# Patient Record
Sex: Male | Born: 1945 | Race: White | Hispanic: No | Marital: Married
Health system: Southern US, Community
[De-identification: ages and names within clinical notes are randomized; demographics above are authoritative.]

## PROBLEM LIST (undated history)

## (undated) DIAGNOSIS — I2510 Atherosclerotic heart disease of native coronary artery without angina pectoris: Secondary | ICD-10-CM

## (undated) DIAGNOSIS — E119 Type 2 diabetes mellitus without complications: Secondary | ICD-10-CM

## (undated) DIAGNOSIS — J45909 Unspecified asthma, uncomplicated: Secondary | ICD-10-CM

---

## 1968-03-07 HISTORY — PX: INGUINAL HERNIA REPAIR: SUR1180

## 2010-05-16 ENCOUNTER — Ambulatory Visit: Payer: Self-pay | Admitting: Family Medicine

## 2012-06-13 IMAGING — CR DG CHEST 2V
1 series · 3 of 3 positions shown · non-contrast
Comparison: none

REASON FOR EXAM: scattered wheezes
COMMENTS:   LMP: (Male)

[Series 1: view not recorded · 0.17mm/px · 3 of 3 slices shown]
[im 1/3]
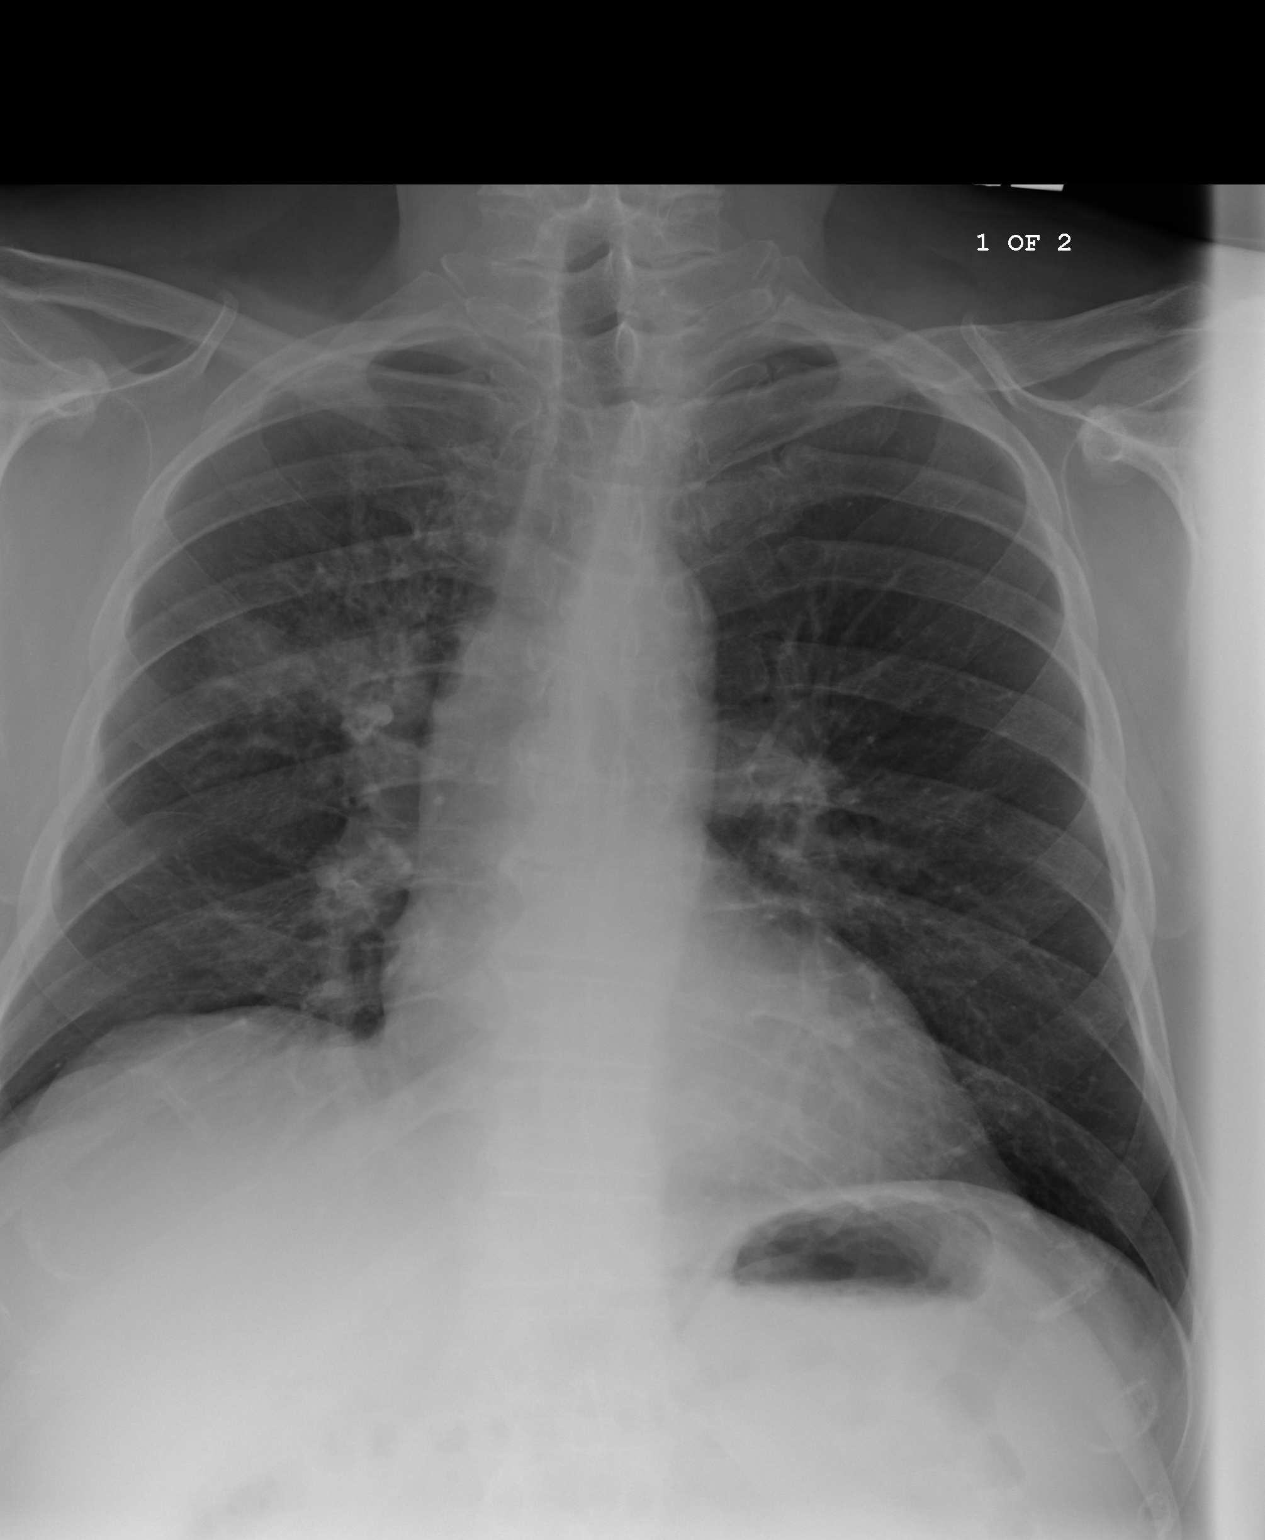
[im 2/3]
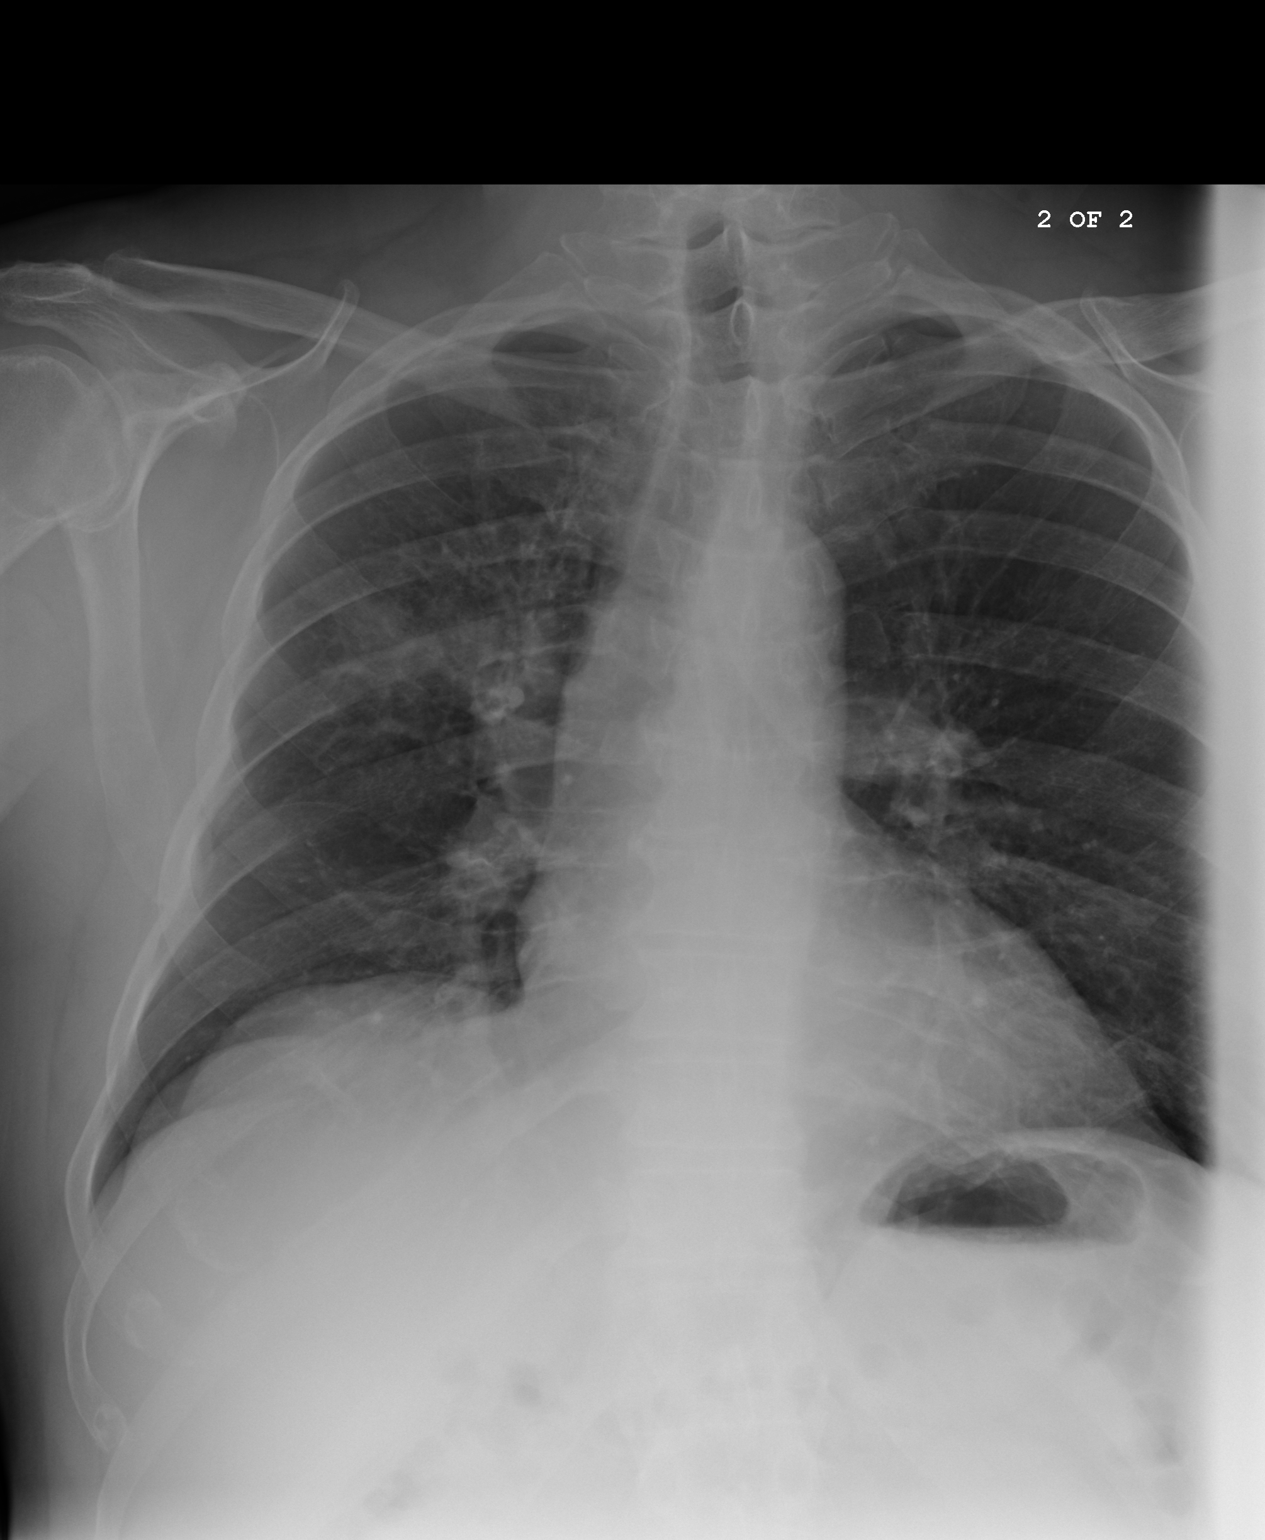
[im 3/3]
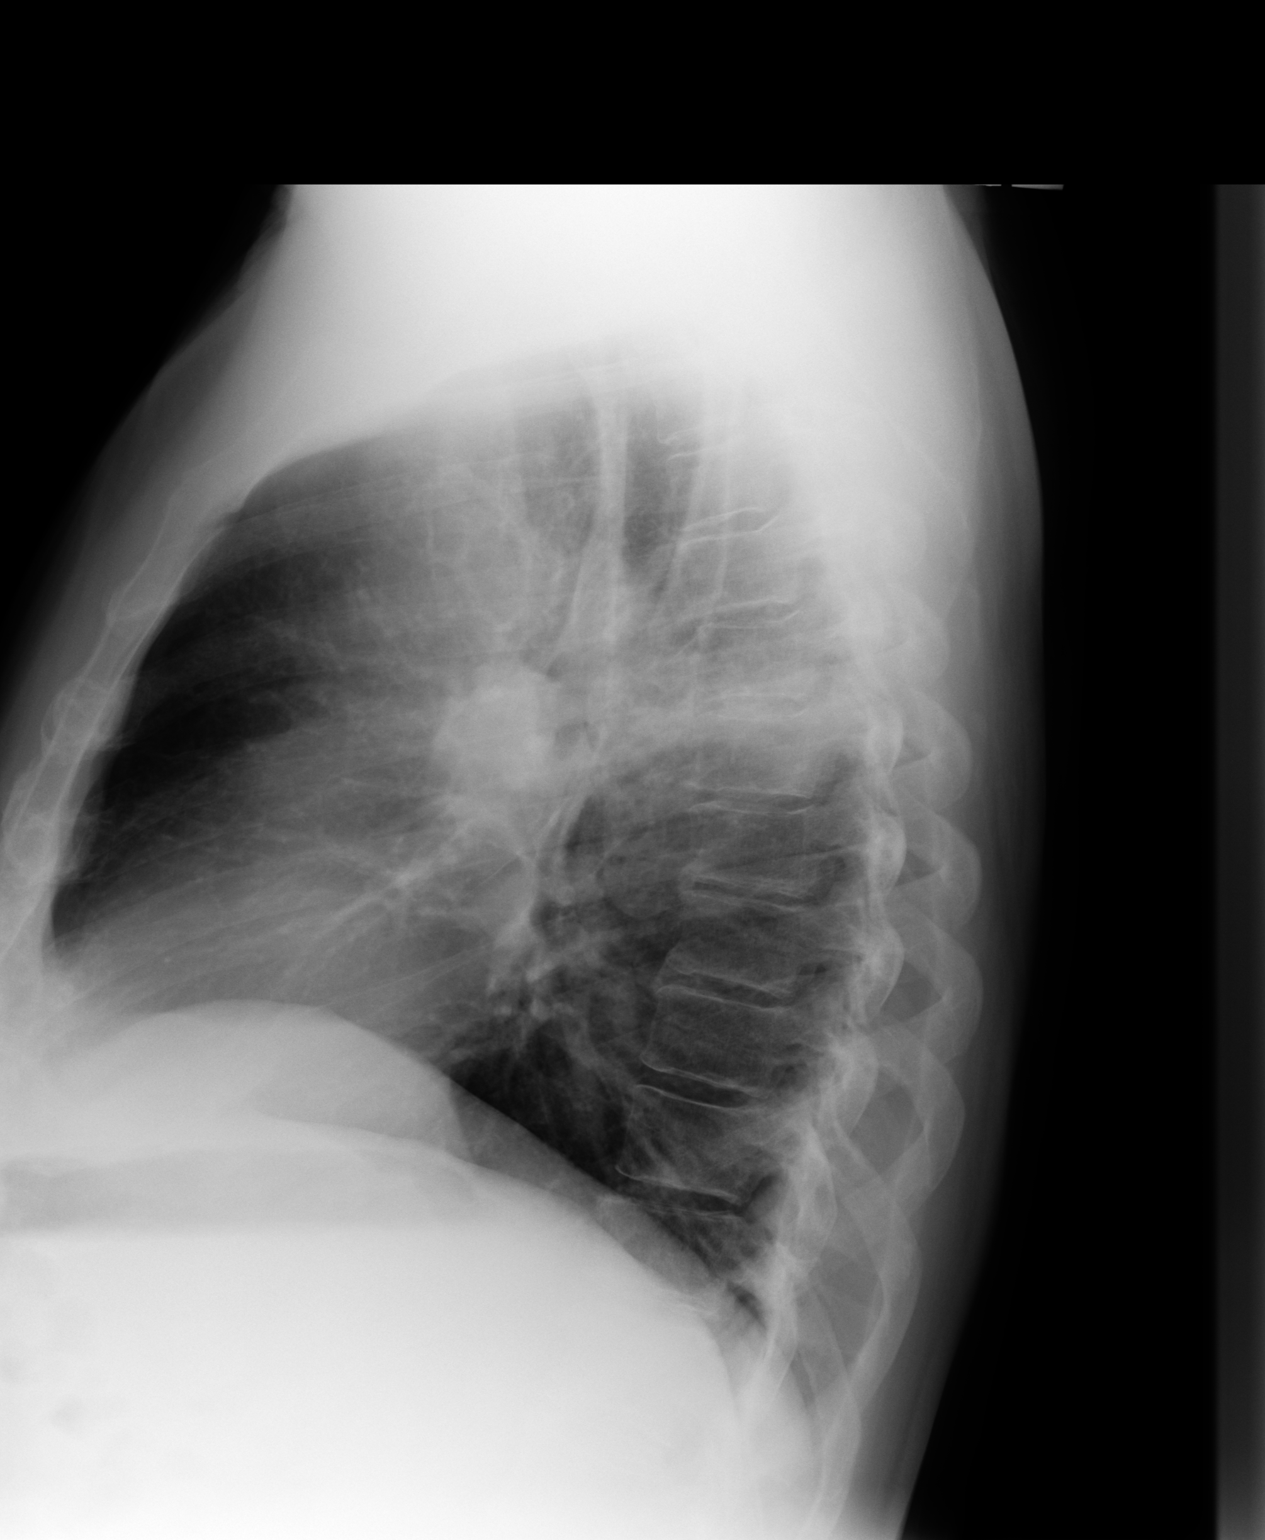

[3 of 3 positions shown; findings below may reference images not displayed]

PROCEDURE:     MDR - MDR CHEST PA(OR AP) AND LATERAL  - May 16, 2010  [DATE]

RESULT:     There are no prior studies for comparison.

The right lung is mildly hypoinflated and there is elevation of the right
hemidiaphragm with respect to the left. There is perihilar increased density
consistent with pneumonia. Infrahilar atelectasis is suspected on the right.
The left lung is clear. The cardiac silhouette is normal in size.
IMPRESSION: The findings suggest pneumonia in the right lower lobe
superiorly or in the posterior inferior aspect of the right upper lobe.
Followup films following therapy are recommended to assure complete clearing.

## 2014-07-13 ENCOUNTER — Ambulatory Visit
Admission: EM | Admit: 2014-07-13 | Discharge: 2014-07-13 | Disposition: A | Discharge status: Home / Self Care | Payer: Medicare Other | Attending: Internal Medicine | Admitting: Internal Medicine

## 2014-07-13 DIAGNOSIS — T1591XA Foreign body on external eye, part unspecified, right eye, initial encounter: Secondary | ICD-10-CM

## 2014-07-13 HISTORY — DX: Atherosclerotic heart disease of native coronary artery without angina pectoris: I25.10

## 2014-07-13 HISTORY — DX: Type 2 diabetes mellitus without complications: E11.9

## 2014-07-13 HISTORY — DX: Unspecified asthma, uncomplicated: J45.909

## 2014-07-13 MED ORDER — TETANUS-DIPHTH-ACELL PERTUSSIS 5-2.5-18.5 LF-MCG/0.5 IM SUSP
0.5000 mL | Freq: Once | INTRAMUSCULAR | Status: AC
  Administered 2014-07-13: 0.5 mL via INTRAMUSCULAR

## 2014-07-13 MED ORDER — SULFACETAMIDE SODIUM 10 % OP SOLN: 1.0000 [drp] | OPHTHALMIC | Status: AC

## 2014-07-13 NOTE — ED Provider Notes (Addendum)
CSN: [id number]     Arrival date & time 07/13/14  1422 History   First MD Initiated Contact with Patient 07/13/14 1511     Chief Complaint  Patient presents with  . Foreign Body in Eye   HPI Patient was grinding metal yesterday afternoon, working on a bathroom fan, and had a small speck of matter fly into his right eye. His daughter was able to visualize it, in the lower lateral aspect of his cornea, but was not able to irrigate it out. Patient denies significant discomfort, but is having a lot of blepharospasm, blinking. No loss of vision.  Past Medical History  Diagnosis Date  . Diabetes mellitus without complication   . Coronary artery disease   . Asthma    Past Surgical History  Procedure Laterality Date  . Inguinal hernia repair  1970   Family History  Problem Relation Age of Onset  . Cancer Mother   . Heart attack Father    History  Substance Use Topics  . Smoking status: Passive Smoke Exposure - Never Smoker  . Smokeless tobacco: Not on file  . Alcohol Use: Yes     Comment: socially    Review of Systems  All other systems reviewed and are negative.   Allergies  Review of patient's allergies indicates no known allergies.  Home Medications   Prior to Admission medications   Medication Sig Start Date End Date Taking? Authorizing Provider  aspirin 81 MG tablet Take 81 mg by mouth daily.   Yes Historical Provider, MD  atorvastatin (LIPITOR) 10 MG tablet Take 10 mg by mouth daily.   Yes Historical Provider, MD  buPROPion (ZYBAN) 150 MG 12 hr tablet Take 150 mg by mouth 2 (two) times daily.   Yes Historical Provider, MD  citalopram (CELEXA) 20 MG tablet Take 20 mg by mouth daily.   Yes Historical Provider, MD  fluticasone-salmeterol (ADVAIR HFA) 230-21 MCG/ACT inhaler Inhale 2 puffs into the lungs 2 (two) times daily.   Yes Historical Provider, MD  glipiZIDE (GLUCOTROL) 10 MG tablet Take 10 mg by mouth daily before breakfast.   Yes Historical Provider, MD  insulin  glargine (LANTUS) 100 UNIT/ML injection Inject 25 Units into the skin at bedtime.   Yes Historical Provider, MD  metformin (FORTAMET) 1000 MG (OSM) 24 hr tablet Take 1,000 mg by mouth 2 (two) times daily with a meal.   Yes Historical Provider, MD          BP 122/71 mmHg  Pulse 70  Temp(Src) 96.8 F (36 C) (Tympanic)  Resp 16  Ht 5\' 6"  (1.676 m)  Wt 189 lb (85.73 kg)  BMI 30.52 kg/m2  SpO2 95% Physical Exam  Constitutional: He is oriented to person, place, and time. No distress.  Alert, nicely groomed  HENT:  Head: Atraumatic.  Eyes:  Conjugate gaze, no eye drainage. Right eye with moderate conjunctival injection, watering. Moderate blepharospasm. No foreign body appreciated on eversion of the right upper lid, none physical beneath the right lower lid. Foreign body is clearly visible in the 7:00 position overlying the iris. Foreign body was removed in 2 fragments with a saline moistened swab; no corneal defect was appreciated on subsequent fluorescein exam. A tiny residual rust ring was appreciated.   Neck: Neck supple.  Cardiovascular: Normal rate.   Pulmonary/Chest: No respiratory distress.  Lungs clear, symmetric breath sounds  Abdominal: Soft. He exhibits no distension.  Musculoskeletal: Normal range of motion.  Neurological: He is alert and oriented to person,  place, and time.  Skin: Skin is warm and dry.  No cyanosis  Nursing note and vitals reviewed.   ED Course  Procedures (including critical care time) Labs Review Labs Reviewed - No data to display  Imaging Review No results found.   Tdap given at urgent care.   MDM   1. Foreign body, eye, right, initial encounter    Patient has follow-up [place] with [place] in [place], Dr. Royetta CrochetPate, in 2 days. The rust ring is not in the visual axis, and as long as discomfort resolves in the next 24 hours I believe it can be safely observed. Rx for sulfacetamide eyedrops. Recheck as soon as possible if severe pain  or vision loss occur.    Eustace MooreLaura W Rondell Pardon, MD 07/13/14 1659  Eustace MooreLaura W Ajahni Nay, MD 07/31/14 [phone]

## 2014-07-13 NOTE — Discharge Instructions (Signed)
°  Use eye drops as directed.  Have eye rechecked for worsening pain, vision loss.  See eye doctor Tuesday 5/10 as planned for recheck about residual rust ring. Eye, Foreign Body A foreign body is an object that should not be there. The object could be near, on, or in the eye. HOME CARE If your doctor prescribes an eye patch:  Keep the eye patch on. Do this until you see your doctor again.  Do not remove the patch to put in medicine unless your doctor tells you.  Retape it as it was before:  When replacing the patch.  If the patch comes loose.  Do not drive or use machinery.  Only take medicine as told by your doctor. If your doctor does not prescribe an eye patch:  Keep the eye closed as much as possible.  Do not rub the eye.  Wear dark glasses in bright light.  Do not wear contact lenses until the eye feels normal, or as told by your doctor.  Wear protective eye covering, especially when using high speed tools.  Only take medicine as told by your doctor. GET HELP RIGHT AWAY IF:   Your pain gets worse.  Your vision changes.  You have problems with the eye patch.  The injury gets larger.  There is fluid (discharge) coming from the eye.  You get puffiness (swelling) and soreness.  You have an oral temperature above 102 F (38.9 C), not controlled by medicine.  Your baby is older than 3 months with a rectal temperature of 102 F (38.9 C) or higher.  Your baby is 703 months old or younger with a rectal temperature of 100.4 F (38 C) or higher. MAKE SURE YOU:   Understand these instructions.  Will watch your condition.  Will get help right away if you are not doing well or get worse. Document Released: 08/11/2009 Document Revised: 05/16/2011 Document Reviewed: 07/19/2012 [facility] Patient Information 2015 [place] [place]. This information is not intended to replace advice given to you by your health care provider. Make sure you discuss any questions you have with  your health care provider.

## 2014-07-13 NOTE — ED Notes (Signed)
Yesterday using a grinder and thinks a piece of metal went into right eye. Daughter is a Engineer, civil (consulting)nurse and flushed eye after noting metal in the eye. Remains red and sore. Snellan chart right eye  20/30  Left eye 20/25. Unsure of Tdap status

## 2015-04-04 DIAGNOSIS — G4733 Obstructive sleep apnea (adult) (pediatric): Secondary | ICD-10-CM | POA: Insufficient documentation

## 2016-06-15 DIAGNOSIS — E119 Type 2 diabetes mellitus without complications: Secondary | ICD-10-CM | POA: Insufficient documentation

## 2016-06-15 DIAGNOSIS — Z794 Long term (current) use of insulin: Secondary | ICD-10-CM | POA: Insufficient documentation

## 2017-05-03 DIAGNOSIS — I10 Essential (primary) hypertension: Secondary | ICD-10-CM | POA: Insufficient documentation

## 2020-02-04 ENCOUNTER — Ambulatory Visit: Payer: Medicare Other | Admitting: Internal Medicine

## 2020-02-04 NOTE — Progress Notes (Signed)
Patient was no-show for appointment.  The office staff will contact the patient for rescheduling follow-up. 

## 2020-02-05 ENCOUNTER — Ambulatory Visit (INDEPENDENT_AMBULATORY_CARE_PROVIDER_SITE_OTHER): Payer: Medicare Other | Admitting: Internal Medicine

## 2020-02-05 VITALS — BP 147/77 | HR 70 | Temp 97.9°F | Resp 18 | Ht 66.0 in | Wt 186.0 lb

## 2020-02-05 DIAGNOSIS — G4733 Obstructive sleep apnea (adult) (pediatric): Secondary | ICD-10-CM | POA: Diagnosis not present

## 2020-02-05 DIAGNOSIS — Z9989 Dependence on other enabling machines and devices: Secondary | ICD-10-CM | POA: Diagnosis not present

## 2020-02-05 DIAGNOSIS — Z7189 Other specified counseling: Secondary | ICD-10-CM | POA: Diagnosis not present

## 2020-02-05 DIAGNOSIS — I10 Essential (primary) hypertension: Secondary | ICD-10-CM | POA: Diagnosis not present

## 2020-02-05 NOTE — Progress Notes (Signed)
[facility] [address] [place], [place] [address]  Pulmonary Sleep Medicine   Office Visit Note  Patient Name: Stuart Ball DOB: [birth year] MRN [id number]    Chief Complaint: Obstructive Sleep Apnea visit  Brief History:  [name] is seen today for initial consultation The patient has a 4 year history of sleep apnea. Patient is not using PAP nightly. He is getting condensation in his tubing so he stopped using it but prior to this reported benefiting greatly from the CPAP The patient feels more rested after sleeping with PAP.  He is only averaging 6 hours of sleep at night. Reported sleepiness is  Improved on CPAP but worse now that he stopped using it. the Epworth Sleepiness Score is10 out of 24. T The patient complains of the following: condensation.  The compliance download shows poor  compliance with an average use time of 5.1 hours. The AHI is 8.6 The patient does not complain of limb movements disrupting sleep.  ROS  General: (-) fever, (-) chills, (-) night sweat Nose and Sinuses: (-) nasal stuffiness or itchiness, (-) postnasal drip, (-) nosebleeds, (-) sinus trouble. Mouth and Throat: (-) sore throat, (-) hoarseness. Neck: (-) swollen glands, (-) enlarged thyroid, (-) neck pain. Respiratory: + cough, - shortness of breath, - wheezing. Neurologic: - numbness, - tingling. Psychiatric: - anxiety, - depression   Current Medication: Outpatient Encounter Medications as of 02/05/2020  Medication Sig  . albuterol (VENTOLIN HFA) 108 (90 Base) MCG/ACT inhaler Inhale into the lungs.  . Aspirin Buf,CaCarb-MgCarb-MgO, 81 MG TABS Take by mouth.  Marland Kitchen buPROPion (WELLBUTRIN SR) 150 MG 12 hr tablet Take by mouth.  . metoprolol tartrate (LOPRESSOR) 50 MG tablet Take 1 tablet by mouth 2 (two) times daily.  Marland Kitchen omeprazole (PRILOSEC) 20 MG capsule TAKE 1 CAPSULE BY MOUTH IN THE EVENING  . aspirin 81 MG tablet Take 81 mg by mouth daily.  Marland Kitchen atorvastatin (LIPITOR) 10 MG tablet Take 10  mg by mouth daily.  Marland Kitchen buPROPion (ZYBAN) 150 MG 12 hr tablet Take 150 mg by mouth 2 (two) times daily.  . citalopram (CELEXA) 20 MG tablet Take 20 mg by mouth daily.  . fluticasone-salmeterol (ADVAIR HFA) 230-21 MCG/ACT inhaler Inhale 2 puffs into the lungs 2 (two) times daily.  Marland Kitchen glipiZIDE (GLUCOTROL) 10 MG tablet Take 10 mg by mouth daily before breakfast.  . insulin glargine (LANTUS) 100 UNIT/ML injection Inject 25 Units into the skin at bedtime.  . metformin (FORTAMET) 1000 MG (OSM) 24 hr tablet Take 1,000 mg by mouth 2 (two) times daily with a meal.  . OZEMPIC, 1 MG/DOSE, 2 MG/1.5ML SOPN Inject into the skin.   No facility-administered encounter medications on file as of 02/05/2020.    Surgical History: Past Surgical History:  Procedure Laterality Date  . INGUINAL HERNIA REPAIR  1970    Medical History: Past Medical History:  Diagnosis Date  . Asthma   . Coronary artery disease   . Diabetes mellitus without complication (HCC)     Family History: Non contributory to the present illness  Social History: Social History   Socioeconomic History  . Marital status: Married    Spouse name: Not on file  . Number of children: Not on file  . Years of education: Not on file  . Highest education level: Not on file  Occupational History  . Not on file  Tobacco Use  . Smoking status: Passive Smoke Exposure - Never Smoker  . Smokeless tobacco: Never Used  Substance and  Sexual Activity  . Alcohol use: Yes    Comment: socially  . Drug use: Not on file  . Sexual activity: Not on file  Other Topics Concern  . Not on file  Social History Narrative  . Not on file   Social Determinants of Health   Financial Resource Strain:   . Difficulty of Paying Living Expenses: Not on file  Food Insecurity:   . Worried About Programme researcher, broadcasting/film/video in the Last Year: Not on file  . Ran Out of Food in the Last Year: Not on file  Transportation Needs:   . Lack of Transportation (Medical): Not on  file  . Lack of Transportation (Non-Medical): Not on file  Physical Activity:   . Days of Exercise per Week: Not on file  . Minutes of Exercise per Session: Not on file  Stress:   . Feeling of Stress : Not on file  Social Connections:   . Frequency of Communication with Friends and Family: Not on file  . Frequency of Social Gatherings with Friends and Family: Not on file  . Attends Religious Services: Not on file  . Active Member of Clubs or Organizations: Not on file  . Attends Banker Meetings: Not on file  . Marital Status: Not on file  Intimate Partner Violence:   . Fear of Current or Ex-Partner: Not on file  . Emotionally Abused: Not on file  . Physically Abused: Not on file  . Sexually Abused: Not on file    Vital Signs: Blood pressure (!) 147/77, pulse 70, temperature 97.9 F (36.6 C), resp. rate 18, height 5\' 6"  (1.676 m), weight 186 lb (84.4 kg), SpO2 95 %.  Examination: General Appearance: The patient is well-developed, well-nourished, and in no distress. Neck Circumference: 10cm Skin: Gross inspection of skin unremarkable. Head: normocephalic, no gross deformities. Eyes: no gross deformities noted. ENT: ears appear grossly normal Neurologic: Alert and oriented. No involuntary movements.    EPWORTH SLEEPINESS SCALE:  Scale:  (0)= no chance of dozing; (1)= slight chance of dozing; (2)= moderate chance of dozing; (3)= high chance of dozing  Chance  Situtation    Sitting and reading: 2    Watching TV: 2    Sitting Inactive in public: 1    As a passenger in car: 1      Lying down to rest: 2    Sitting and talking: 0    Sitting quielty after lunch: 2    In a car, stopped in traffic: 0   TOTAL SCORE:   10 out of 24    SLEEP STUDIES:  1. Split 04/01/15 AHI 20 Spo2Min 85%   CPAP COMPLIANCE DATA:  Date Range: 12/20/19-01/18/20  Average Daily Use: 5 hours  Median Use: 4  Compliance for > 4 Hours:  33%  AHI: 8.2 respiratory  events per hour  Days Used: 13/30  Mask Leak: 50.9  95th Percentile Pressure: CPAP 12cm H20         LABS: No results found for this or any previous visit (from the past 2160 hour(s)).  Radiology: No results found.  No results found.  No results found.    Assessment and Plan: Patient Active Problem List   Diagnosis Date Noted  . Obstructive sleep apnea 04/04/2015      The patient does tolerate PAP and reports significant benefit from PAP use when using it. He stopped because of water in the tubing and he was shown how to adjust the humidifier and  tubing temperature.. The patient was  advised to increase his time in bed and he should replace his supplies more frequently.. The patient was also counselled on the importance of regular exercise. The compliance is poor. The AHI is elevated due to mask leak and short use.   1. OSA- use CPAP nightly, replace supplies regularly. 2. CPAP couseling-Discussed importance of adequate CPAP use as well as proper care and cleaning techniques of machine and all supplies. 3. HTN - followed and managed by PCP  General Counseling: I have discussed the findings of the evaluation and examination with Renae Fickle.  I have also discussed any further diagnostic evaluation thatmay be needed or ordered today. Shawan verbalizes understanding of the findings of todays visit. We also reviewed his medications today and discussed drug interactions and side effects including but not limited excessive drowsiness and altered mental states. We also discussed that there is always a risk not just to him but also people around him. he has been encouraged to call the office with any questions or concerns that should arise related to todays visit.  No orders of the defined types were placed in this encounter.   This patient was seen by Layla Barter, AGNP-C in collaboration with Dr. Freda Munro as a part of collaborative care agreement.     I have personally  obtained a history, examined the patient, evaluated laboratory and imaging results, formulated the assessment and plan and placed orders.   Valentino Hue Sol Blazing, PhD, FAASM  Diplomate, American Board of Sleep Medicine    Yevonne Pax, MD [facility] Diplomate ABMS Pulmonary and Critical Care Medicine Sleep medicine

## 2020-02-05 NOTE — Patient Instructions (Signed)

## 2020-04-06 NOTE — Progress Notes (Signed)
[facility] [address] [place], [place] [address]  Pulmonary Sleep Medicine   Office Visit Note  Patient Name: Stuart Ball DOB: 10-31-1945 MRN [id number]    Chief Complaint: Obstructive Sleep Apnea visit  Brief History:  Stuart Ball is seen today for follow up The patient has a 4 year history of sleep apnea but was not using his CPAP at his last visit. Patient is not using PAP nightly.  The patient feels more rested after sleeping with PAP.  The patient reports benefiting from PAP use.His sleep time is variable as he is a night owl. He also does not use the CPAP when his grandchildren are over as he sleeps in another room.Reported sleepiness is  improved and the Epworth Sleepiness Score is 4 out of 24. The patient does not take intentional  take naps. The patient complains of the following: dry mouth and his mouth may be opening  The compliance download shows fair compliance with an average use time of 6.2 hours. The AHI is 6.9  The patient does not of limb movements disrupting sleep.  ROS  General: (-) fever, (-) chills, (-) night sweat Nose and Sinuses: (-) nasal stuffiness or itchiness, (-) postnasal drip, (-) nosebleeds, (-) sinus trouble. Mouth and Throat: (-) sore throat, (-) hoarseness. Neck: (-) swollen glands, (-) enlarged thyroid, (-) neck pain. Respiratory: - cough, - shortness of breath, - wheezing. Neurologic: - numbness, - tingling. Psychiatric: - anxiety, - depression   Current Medication: Outpatient Encounter Medications as of 04/07/2020  Medication Sig  . albuterol (VENTOLIN HFA) 108 (90 Base) MCG/ACT inhaler Inhale into the lungs.  Marland Kitchen aspirin 81 MG tablet Take 81 mg by mouth daily.  . Aspirin Buf,CaCarb-MgCarb-MgO, 81 MG TABS Take by mouth.  Marland Kitchen atorvastatin (LIPITOR) 10 MG tablet Take 10 mg by mouth daily.  Marland Kitchen buPROPion (WELLBUTRIN SR) 150 MG 12 hr tablet Take by mouth.  Marland Kitchen buPROPion (ZYBAN) 150 MG 12 hr tablet Take 150 mg by mouth 2 (two) times daily.  .  citalopram (CELEXA) 20 MG tablet Take 20 mg by mouth daily.  . fluticasone-salmeterol (ADVAIR HFA) 230-21 MCG/ACT inhaler Inhale 2 puffs into the lungs 2 (two) times daily.  Marland Kitchen glipiZIDE (GLUCOTROL) 10 MG tablet Take 10 mg by mouth daily before breakfast.  . insulin aspart (NOVOLOG FLEXPEN) 100 UNIT/ML FlexPen Novolog Flexpen U-100 Insulin aspart 100 unit/mL (3 mL) subcutaneous  . insulin glargine (LANTUS) 100 UNIT/ML injection Inject 25 Units into the skin at bedtime.  Marland Kitchen JARDIANCE 25 MG TABS tablet Take 25 mg by mouth daily.  . metformin (FORTAMET) 1000 MG (OSM) 24 hr tablet Take 1,000 mg by mouth 2 (two) times daily with a meal.  . metoprolol tartrate (LOPRESSOR) 50 MG tablet Take 1 tablet by mouth 2 (two) times daily.  Marland Kitchen omeprazole (PRILOSEC) 20 MG capsule TAKE 1 CAPSULE BY MOUTH IN THE EVENING  . OZEMPIC, 1 MG/DOSE, 2 MG/1.5ML SOPN Inject into the skin.   No facility-administered encounter medications on file as of 04/07/2020.    Surgical History: Past Surgical History:  Procedure Laterality Date  . INGUINAL HERNIA REPAIR  1970    Medical History: Past Medical History:  Diagnosis Date  . Asthma   . Coronary artery disease   . Diabetes mellitus without complication (HCC)     Family History: Non contributory to the present illness  Social History: Social History   Socioeconomic History  . Marital status: Married    Spouse name: Not on file  . Number  of children: Not on file  . Years of education: Not on file  . Highest education level: Not on file  Occupational History  . Not on file  Tobacco Use  . Smoking status: Passive Smoke Exposure - Never Smoker  . Smokeless tobacco: Never Used  Substance and Sexual Activity  . Alcohol use: Yes    Comment: socially  . Drug use: Not on file  . Sexual activity: Not on file  Other Topics Concern  . Not on file  Social History Narrative  . Not on file   Social Determinants of Health   Financial Resource Strain: Not on file   Food Insecurity: Not on file  Transportation Needs: Not on file  Physical Activity: Not on file  Stress: Not on file  Social Connections: Not on file  Intimate Partner Violence: Not on file    Vital Signs: Blood pressure (!) 149/80, pulse 67, temperature (!) 97.2 F (36.2 C), resp. rate 18, height 5\' 6"  (1.676 m), weight 191 lb (86.6 kg), SpO2 96 %.  Examination: General Appearance: The patient is well-developed, well-nourished, and in no distress. Neck Circumference: 42 cm Skin: Gross inspection of skin unremarkable. Head: normocephalic, no gross deformities. Eyes: no gross deformities noted. ENT: ears appear grossly normal Neurologic: Alert and oriented. No involuntary movements.    EPWORTH SLEEPINESS SCALE:  Scale:  (0)= no chance of dozing; (1)= slight chance of dozing; (2)= moderate chance of dozing; (3)= high chance of dozing  Chance  Situtation    Sitting and reading: 0    Watching TV: 2    Sitting Inactive in public: 1    As a passenger in car: 0      Lying down to rest: 0    Sitting and talking: 0    Sitting quielty after lunch: 1    In a car, stopped in traffic: 0   TOTAL SCORE:   4 out of 24    SLEEP STUDIES:  1. Split 04/01/15 AHI 20 Spo29min 88%   CPAP COMPLIANCE DATA:  Date Range: 02/01/20-03/31/20  Average Daily Use:6  hours  Median Use: 6  Compliance for > 4 Hours: 70%   AHI: 6.9 respiratory events per hour  Days Used: 47/60  Mask Leak: 49.9  95th Percentile Pressure: 2         LABS: No results found for this or any previous visit (from the past 2160 hour(s)).  Radiology: No results found.  No results found.  No results found.    Assessment and Plan: Patient Active Problem List   Diagnosis Date Noted  . Essential hypertension 05/03/2017  . Type 2 diabetes mellitus, with long-term current use of insulin (HCC) 06/15/2016  . Obstructive sleep apnea 04/04/2015      The patient dpes tolerate PAP and  reports significant  benefit from PAP use. The patient was reminded to use the CPAP nightly  and advised to add a chin strap.. The patient was also counselled on the importance of regular exercise. The compliance is fair. The AHI is elevated due to continuing mask leak.   1. OSA- use CPAP nightly. Will get a 2 week download to see the effects of the chin strap on leak and AHI 2. CPAP couseling-Discussed importance of adequate CPAP use as well as proper care and cleaning techniques of machine and all supplies. 3. HTN - BP elevated in office. Continue to monitor and f/u with PCP. 4. Asthma - Stable. Continue current medication. 5. Obesity - Discussed the  importance of weight management through healthy eating and daily exercise as tolerated. Discussed the negative effects obesity has on pulmonary health, cardiac health as well as overall general health and well being.  General Counseling: I have discussed the findings of the evaluation and examination with Renae Fickle.  I have also discussed any further diagnostic evaluation thatmay be needed or ordered today. Dmani verbalizes understanding of the findings of todays visit. We also reviewed his medications today and discussed drug interactions and side effects including but not limited excessive drowsiness and altered mental states. We also discussed that there is always a risk not just to him but also people around him. he has been encouraged to call the office with any questions or concerns that should arise related to todays visit.   Hypertension Counseling:   The following hypertensive lifestyle modification were recommended and discussed:  1. Limiting alcohol intake to less than 1 oz/day of ethanol:(24 oz of beer or 8 oz of wine or 2 oz of 100-proof whiskey). 2. Take baby ASA 81 mg daily. 3. Importance of regular aerobic exercise and losing weight. 4. Reduce dietary saturated fat and cholesterol intake for overall cardiovascular health. 5. Maintaining  adequate dietary potassium, calcium, and magnesium intake. 6. Regular monitoring of the blood pressure. 7. Reduce sodium intake to less than 100 mmol/day (less than 2.3 gm of sodium or less than 6 gm of sodium choride)     I have personally obtained a history, examined the patient, evaluated laboratory and imaging results, formulated the assessment and plan and placed orders.  This patient was seen by Lynn Ito, PA-C in collaboration with Dr. Freda Munro as a part of collaborative care agreement.   Valentino Hue Sol Blazing, PhD, FAASM  Diplomate, American Board of Sleep Medicine    Yevonne Pax, MD [facility] Diplomate ABMS Pulmonary and Critical Care Medicine Sleep medicine

## 2020-04-07 ENCOUNTER — Ambulatory Visit (INDEPENDENT_AMBULATORY_CARE_PROVIDER_SITE_OTHER): Payer: Medicare Other | Admitting: Internal Medicine

## 2020-04-07 VITALS — BP 149/80 | HR 67 | Temp 97.2°F | Resp 18 | Ht 66.0 in | Wt 191.0 lb

## 2020-04-07 DIAGNOSIS — I10 Essential (primary) hypertension: Secondary | ICD-10-CM | POA: Diagnosis not present

## 2020-04-07 DIAGNOSIS — Z7189 Other specified counseling: Secondary | ICD-10-CM | POA: Diagnosis not present

## 2020-04-07 DIAGNOSIS — J4520 Mild intermittent asthma, uncomplicated: Secondary | ICD-10-CM

## 2020-04-07 DIAGNOSIS — Z9989 Dependence on other enabling machines and devices: Secondary | ICD-10-CM | POA: Diagnosis not present

## 2020-04-07 DIAGNOSIS — E669 Obesity, unspecified: Secondary | ICD-10-CM

## 2020-04-07 DIAGNOSIS — G4733 Obstructive sleep apnea (adult) (pediatric): Secondary | ICD-10-CM

## 2020-04-07 NOTE — Patient Instructions (Signed)

## 2021-03-08 NOTE — Progress Notes (Signed)
No show for appointment. Office will call to reschedule.  

## 2021-03-09 ENCOUNTER — Ambulatory Visit: Payer: Self-pay | Admitting: Internal Medicine
